# Patient Record
Sex: Female | Born: 1985 | Race: White | Hispanic: No | Smoking: Current every day smoker
Health system: Southern US, Community
[De-identification: ages and names within clinical notes are randomized; demographics above are authoritative.]

## PROBLEM LIST (undated history)

## (undated) HISTORY — PX: ABDOMINAL HYSTERECTOMY: SHX81

## (undated) HISTORY — PX: CHOLECYSTECTOMY: SHX55

---
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
## 2007-05-18 ENCOUNTER — Emergency Department (HOSPITAL_COMMUNITY): Admission: EM | Admit: 2007-05-18 | Discharge: 2007-05-18 | Payer: Self-pay | Admitting: Emergency Medicine

## 2010-02-08 ENCOUNTER — Emergency Department (HOSPITAL_COMMUNITY): Admission: EM | Admit: 2010-02-08 | Discharge: 2010-02-08 | Payer: Self-pay | Admitting: Family Medicine

## 2011-01-22 ENCOUNTER — Inpatient Hospital Stay (INDEPENDENT_AMBULATORY_CARE_PROVIDER_SITE_OTHER)
Admission: RE | Admit: 2011-01-22 | Discharge: 2011-01-22 | Disposition: A | Discharge status: Home / Self Care | Payer: Self-pay | Source: Ambulatory Visit | Attending: Family Medicine | Admitting: Family Medicine

## 2011-01-22 DIAGNOSIS — J029 Acute pharyngitis, unspecified: Secondary | ICD-10-CM

## 2011-11-08 ENCOUNTER — Encounter (HOSPITAL_COMMUNITY): Payer: Self-pay

## 2011-11-08 ENCOUNTER — Emergency Department (HOSPITAL_COMMUNITY)
Admission: EM | Admit: 2011-11-08 | Discharge: 2011-11-08 | Disposition: A | Discharge status: Home / Self Care | Payer: Self-pay | Attending: Emergency Medicine | Admitting: Emergency Medicine

## 2011-11-08 DIAGNOSIS — R51 Headache: Secondary | ICD-10-CM | POA: Insufficient documentation

## 2011-11-08 DIAGNOSIS — K029 Dental caries, unspecified: Secondary | ICD-10-CM | POA: Insufficient documentation

## 2011-11-08 MED ORDER — IBUPROFEN 800 MG PO TABS
800.0000 mg | ORAL_TABLET | Freq: Once | ORAL | Status: AC
  Administered 2011-11-08: 800 mg via ORAL
  Filled 2011-11-08: qty 1

## 2011-11-08 MED ORDER — ONDANSETRON HCL 4 MG PO TABS
4.0000 mg | ORAL_TABLET | Freq: Once | ORAL | Status: AC
  Administered 2011-11-08: 4 mg via ORAL
  Filled 2011-11-08: qty 1

## 2011-11-08 MED ORDER — HYDROCODONE-ACETAMINOPHEN 5-325 MG PO TABS
2.0000 | ORAL_TABLET | Freq: Once | ORAL | Status: AC
  Administered 2011-11-08: 2 via ORAL
  Filled 2011-11-08: qty 2

## 2011-11-08 MED ORDER — HYDROCODONE-ACETAMINOPHEN 5-325 MG PO TABS: 1.0000 | ORAL_TABLET | ORAL | Status: AC | PRN

## 2011-11-08 MED ORDER — PENICILLIN V POTASSIUM 250 MG PO TABS
500.0000 mg | ORAL_TABLET | Freq: Once | ORAL | Status: AC
  Administered 2011-11-08: 500 mg via ORAL
  Filled 2011-11-08: qty 2

## 2011-11-08 MED ORDER — AMOXICILLIN 500 MG PO CAPS: ORAL_CAPSULE | ORAL | Status: DC

## 2011-11-08 NOTE — ED Notes (Signed)
Dental pain. 

## 2011-11-08 NOTE — Discharge Instructions (Signed)
Dental Pain     Toothache is pain in or around a tooth. It may get worse with chewing or with cold or heat.   HOME CARE  · Your dentist may use a numbing medicine during treatment. If so, you may need to avoid eating until the medicine wears off. Ask your dentist about this.   · Only take medicine as told by your dentist or doctor.   · Avoid chewing food near the painful tooth until after all treatment is done. Ask your dentist about this.   GET HELP RIGHT AWAY IF:   · The problem gets worse or new problems appear.   · You have a fever.   · There is redness and puffiness (swelling) of the face, jaw, or neck.   · You cannot open your mouth.   · There is pain in the jaw.   · There is very bad pain that is not helped by medicine.   MAKE SURE YOU:   · Understand these instructions.   · Will watch your condition.   · Will get help right away if you are not doing well or get worse.   Document Released: 02/13/2008 Document Revised: 05/09/2011 Document Reviewed: 02/13/2008  Exit[employer]® Patient Information ©2012 [employer].

## 2011-11-08 NOTE — ED Provider Notes (Signed)
History     CSN: [id number]  Arrival date & time 11/08/11  Rickey Primus   First MD Initiated Contact with Patient 11/08/11 1849      Chief Complaint  Patient presents with  . Dental Pain    (Consider location/radiation/quality/duration/timing/severity/associated sxs/prior treatment) Patient is a 26 y.o. female presenting with tooth pain. The history is provided by the patient.  Dental PainThe primary symptoms include mouth pain and headaches. Primary symptoms do not include shortness of breath or cough. The symptoms began 3 to 5 days ago. The symptoms are unchanged. The symptoms occur constantly.  The headache is not associated with photophobia.  Additional symptoms include: dental sensitivity to temperature, gum swelling and jaw pain. Additional symptoms do not include: nosebleeds. Medical issues do not include: alcohol problem and chewing tobacco.    History reviewed. No pertinent past medical history.  Past Surgical History  Procedure Date  . Abdominal hysterectomy     No family history on file.  History  Substance Use Topics  . Smoking status: Never Smoker   . Smokeless tobacco: Not on file  . Alcohol Use: No    OB History    Grav Para Term Preterm Abortions TAB SAB Ect Mult Living                  Review of Systems  Constitutional: Negative for activity change.       All ROS Neg except as noted in HPI  HENT: Negative for nosebleeds and neck pain.   Eyes: Negative for photophobia and discharge.  Respiratory: Negative for cough, shortness of breath and wheezing.   Cardiovascular: Negative for chest pain and palpitations.  Gastrointestinal: Negative for abdominal pain and blood in stool.  Genitourinary: Negative for dysuria, frequency and hematuria.  Musculoskeletal: Negative for back pain and arthralgias.  Skin: Negative.   Neurological: Positive for headaches. Negative for dizziness, seizures and speech difficulty.  Psychiatric/Behavioral: Negative for  hallucinations and confusion.    Allergies  Review of patient's allergies indicates no known allergies.  Home Medications   Current Outpatient Rx  Name Route Sig Dispense Refill  . IBUPROFEN 200 MG PO TABS Oral Take 1,200 mg by mouth 2 (two) times daily as needed. For pain      BP 123/82  Pulse 90  Temp(Src) 97.9 F (36.6 C) (Oral)  Resp 18  Ht 5\' 6"  (1.676 m)  Wt 180 lb (81.647 kg)  BMI 29.05 kg/m2  SpO2 100%  Physical Exam  Nursing note and vitals reviewed. Constitutional: She is oriented to person, place, and time. She appears well-developed and well-nourished.  Non-toxic appearance.  HENT:  Head: Normocephalic.  Right Ear: Tympanic membrane and external ear normal.  Left Ear: Tympanic membrane and external ear normal.       Dental caries present right greater than left. No visible abscess noted.  Eyes: EOM and lids are normal. Pupils are equal, round, and reactive to light.  Neck: Normal range of motion. Neck supple. Carotid bruit is not present.  Cardiovascular: Normal rate, regular rhythm, normal heart sounds, intact distal pulses and normal pulses.   Pulmonary/Chest: Breath sounds normal. No respiratory distress.  Abdominal: Soft. Bowel sounds are normal. There is no tenderness. There is no guarding.  Musculoskeletal: Normal range of motion.  Lymphadenopathy:       Head (right side): No submandibular adenopathy present.       Head (left side): No submandibular adenopathy present.    She has no cervical adenopathy.  Neurological: She  is alert and oriented to person, place, and time. She has normal strength. No cranial nerve deficit or sensory deficit.  Skin: Skin is warm and dry.  Psychiatric: She has a normal mood and affect. Her speech is normal.    ED Course  Procedures (including critical care time) Pulse oximetry 100% on room air. Within normal limits by my interpretation. Labs Reviewed - No data to display No results found.   Dx: Dental caries   MDM   I have reviewed nursing notes, vital signs, and all appropriate lab and imaging results for this patient. Patient presents with 3-4 days of right-sided dental pain. She is attempted to get an appointment with her dentist but no appointment is available for 3 months. She has tried ibuprofen at home without success she's tried Orajel also without success. The plan at this time is for the patient to receive a prescription for amoxicillin, and Norco. Patient is to continue use of her Motrin.       Kathie Dike, [place] 11/08/11 1857

## 2011-11-09 NOTE — ED Provider Notes (Signed)
Medical screening examination/treatment/procedure(s) were performed by non-physician practitioner and as supervising physician I was immediately available for consultation/collaboration.  Nicoletta Dress. Colon Branch, MD 11/09/11 1656

## 2019-02-27 ENCOUNTER — Emergency Department (HOSPITAL_COMMUNITY)
Admission: EM | Admit: 2019-02-27 | Discharge: 2019-02-27 | Disposition: A | Discharge status: Home / Self Care | Payer: Self-pay | Attending: Emergency Medicine | Admitting: Emergency Medicine

## 2019-02-27 ENCOUNTER — Encounter (HOSPITAL_COMMUNITY): Payer: Self-pay | Admitting: *Deleted

## 2019-02-27 ENCOUNTER — Other Ambulatory Visit: Payer: Self-pay

## 2019-02-27 DIAGNOSIS — K0381 Cracked tooth: Secondary | ICD-10-CM | POA: Insufficient documentation

## 2019-02-27 DIAGNOSIS — Z91040 Latex allergy status: Secondary | ICD-10-CM | POA: Insufficient documentation

## 2019-02-27 DIAGNOSIS — F17200 Nicotine dependence, unspecified, uncomplicated: Secondary | ICD-10-CM | POA: Insufficient documentation

## 2019-02-27 DIAGNOSIS — K0889 Other specified disorders of teeth and supporting structures: Secondary | ICD-10-CM

## 2019-02-27 DIAGNOSIS — K029 Dental caries, unspecified: Secondary | ICD-10-CM | POA: Insufficient documentation

## 2019-02-27 MED ORDER — PENICILLIN V POTASSIUM 500 MG PO TABS: 500.0000 mg | ORAL_TABLET | Freq: Three times a day (TID) | ORAL | 0 refills | Status: DC

## 2019-02-27 MED ORDER — HYDROCODONE-ACETAMINOPHEN 5-325 MG PO TABS: ORAL_TABLET | ORAL | 0 refills | Status: DC

## 2019-02-27 NOTE — ED Provider Notes (Signed)
[facility]ERGENCY DEPARTMENT Provider Note   CSN: [id number] Arrival date & time: 02/27/19  0941     History   Chief Complaint Chief Complaint  Patient presents with  . Dental Pain    HPI Wanda Stokes is a 33 y.o. female.     HPI   Wanda Stokes is a 33 y.o. female who presents to the Emergency Department complaining of right lower dental pain for 4-5 days.  She states that one of her right lower teeth broke off recently while eating and she has been having pain to the tooth since then.  She has tried multiple OTC pain relievers and topicals without relief.  States she was unable to sleep last night due to constant, throbbing pain.  She denies fever, chills, facial swelling, neck pain, and difficulty swallowing or breathing.  She has a dentist appt in one and on half weeks.      History reviewed. No pertinent past medical history.  There are no active problems to display for this patient.   Past Surgical History:  Procedure Laterality Date  . ABDOMINAL HYSTERECTOMY    . CHOLECYSTECTOMY       OB History   No obstetric history on file.      Home Medications    Prior to Admission medications   Medication Sig Start Date End Date Taking? Authorizing Provider  amoxicillin (AMOXIL) 500 MG capsule 2 tabs by mouth twice a day, take with food. 11/08/11   Ivery QualeBryant, [place], PA-C  ibuprofen (ADVIL,MOTRIN) 200 MG tablet Take 1,200 mg by mouth 2 (two) times daily as needed. For pain    [provider]    Family History No family history on file.  Social History Social History   Tobacco Use  . Smoking status: Current Every Day Smoker  Substance Use Topics  . Alcohol use: No  . Drug use: No     Allergies   Latex, Motrin [ibuprofen], Toradol [ketorolac tromethamine], Tramadol, and Trazodone and nefazodone   Review of Systems Review of Systems  Constitutional: Negative for appetite change and fever.  HENT: Positive for dental problem. Negative for  congestion, facial swelling, sore throat and trouble swallowing.   Eyes: Negative for pain and visual disturbance.  Respiratory: Negative for chest tightness and shortness of breath.   Cardiovascular: Negative for chest pain and palpitations.  Gastrointestinal: Negative for nausea and vomiting.  Musculoskeletal: Negative for arthralgias, back pain, neck pain and neck stiffness.  Neurological: Negative for dizziness, facial asymmetry and headaches.  Hematological: Negative for adenopathy.     Physical Exam Updated Vital Signs BP 110/76   Pulse (!) 111   Temp 98.1 F (36.7 C)   Resp 20   Ht 5\' 4"  (1.626 m)   Wt 75 kg   SpO2 99%   BMI 28.38 kg/m   Physical Exam Vitals signs and nursing note reviewed.  Constitutional:      General: She is not in acute distress.    Appearance: Normal appearance. She is well-developed. She is not ill-appearing.  HENT:     Head: Normocephalic and atraumatic.     Jaw: No trismus.     Right Ear: Tympanic membrane and ear canal normal.     Left Ear: Tympanic membrane and ear canal normal.     Mouth/Throat:     Mouth: Mucous membranes are moist.     Dentition: Dental caries present. No dental abscesses.     Pharynx: Oropharynx is clear. Uvula midline. No uvula swelling.  Comments: Multiple dental caries present with ttp of the right lower second molar.  Tooth is decayed to the gumline.  Mild erythema of the gingiva.  No fluctuance or facial edema.  No trismus Eyes:     Conjunctiva/sclera: Conjunctivae normal.  Neck:     Musculoskeletal: Normal range of motion and neck supple.  Cardiovascular:     Rate and Rhythm: Regular rhythm. Tachycardia present.     Heart sounds: Normal heart sounds. No murmur.  Pulmonary:     Effort: Pulmonary effort is normal.     Breath sounds: Normal breath sounds.  Musculoskeletal: Normal range of motion.  Lymphadenopathy:     Cervical: No cervical adenopathy.  Skin:    General: Skin is warm and dry.      Capillary Refill: Capillary refill takes less than 2 seconds.  Neurological:     General: No focal deficit present.     Mental Status: She is alert.     Sensory: No sensory deficit.     Motor: No weakness or abnormal muscle tone.      ED Treatments / Results  Labs (all labs ordered are listed, but only abnormal results are displayed) Labs Reviewed - No data to display  EKG    Radiology No results found.  Procedures Procedures (including critical care time)  Medications Ordered in ED Medications - No data to display   Initial Impression / Assessment and Plan / ED Course  I have reviewed the triage vital signs and the nursing notes.  Pertinent labs & imaging results that were available during my care of the patient were reviewed by me and considered in my medical decision making (see chart for details).        Pt with dental pain for 4-5 days.  Hx of same.  Has a dentist appt in one and half weeks.  She is tachycardic on today's visit, she reports hx of tachycardia, but I have been able to verify this in her medical records.  She does appear uncomfortable holding her face in her hand, but she denies fever, chills, chest pain, dyspnea or other symptoms. She is non-toxic appearing.   No murmur is appreciated on her exam.  I feel she is appropriate for d/c home.  Strict return precautions discussed.   Final Clinical Impressions(s) / ED Diagnoses   Final diagnoses:  Pain, dental    ED Discharge Orders    None       Kem Parkinson, PA-C 02/27/19 1045    Long, Wonda Olds, MD 02/27/19 2005

## 2019-02-27 NOTE — ED Triage Notes (Signed)
Right sided dental pain for 2 days, unable to sleep

## 2019-02-27 NOTE — Discharge Instructions (Signed)
Warm water rinses to your mouth.  Follow-up with your dentist as scheduled.

## 2019-04-11 ENCOUNTER — Other Ambulatory Visit: Payer: Self-pay

## 2019-04-11 ENCOUNTER — Encounter (HOSPITAL_COMMUNITY): Payer: Self-pay | Admitting: Emergency Medicine

## 2019-04-11 ENCOUNTER — Emergency Department (HOSPITAL_COMMUNITY)
Admission: EM | Admit: 2019-04-11 | Discharge: 2019-04-11 | Disposition: A | Discharge status: Home / Self Care | Payer: Medicaid Other | Attending: Emergency Medicine | Admitting: Emergency Medicine

## 2019-04-11 DIAGNOSIS — F17200 Nicotine dependence, unspecified, uncomplicated: Secondary | ICD-10-CM | POA: Insufficient documentation

## 2019-04-11 DIAGNOSIS — G8929 Other chronic pain: Secondary | ICD-10-CM | POA: Insufficient documentation

## 2019-04-11 DIAGNOSIS — M5431 Sciatica, right side: Secondary | ICD-10-CM

## 2019-04-11 DIAGNOSIS — Z91040 Latex allergy status: Secondary | ICD-10-CM | POA: Insufficient documentation

## 2019-04-11 DIAGNOSIS — Z79899 Other long term (current) drug therapy: Secondary | ICD-10-CM | POA: Insufficient documentation

## 2019-04-11 MED ORDER — DICLOFENAC SODIUM 75 MG PO TBEC: 75.0000 mg | DELAYED_RELEASE_TABLET | Freq: Two times a day (BID) | ORAL | 0 refills | Status: DC

## 2019-04-11 MED ORDER — LIDOCAINE 5 % EX PTCH: 1.0000 | MEDICATED_PATCH | CUTANEOUS | 0 refills | Status: DC

## 2019-04-11 MED ORDER — CYCLOBENZAPRINE HCL 10 MG PO TABS: 10.0000 mg | ORAL_TABLET | Freq: Two times a day (BID) | ORAL | 0 refills | Status: DC | PRN

## 2019-04-11 MED ORDER — OXYCODONE-ACETAMINOPHEN 5-325 MG PO TABS
1.0000 | ORAL_TABLET | Freq: Once | ORAL | Status: AC
  Administered 2019-04-11: 14:00:00 1 via ORAL
  Filled 2019-04-11: qty 1

## 2019-04-11 MED ORDER — CYCLOBENZAPRINE HCL 10 MG PO TABS
10.0000 mg | ORAL_TABLET | Freq: Once | ORAL | Status: AC
  Administered 2019-04-11: 10 mg via ORAL
  Filled 2019-04-11: qty 1

## 2019-04-11 NOTE — ED Triage Notes (Signed)
Pt c/o right lower back pain that radiates down right leg x 2 days after lifting a crate that was heavy.

## 2019-04-11 NOTE — ED Triage Notes (Signed)
Seen at [facility] in [place], [place] for same issue.  Given morphine, steroid and muscle relaxer.  Pt says Morphine only last 20 minutes.  Seen home with tylenol, muscle relaxer and steroid.  Pt says tylenol upsets her stomach.

## 2019-04-11 NOTE — Discharge Instructions (Addendum)
Finish the prednisone taper you were prescribed yesterday. Use the flexeril in place of the robaxin you were prescribed yesterday Avoid lifting,  Bending,  Twisting or any other activity that worsens your pain over the next week.  Apply a heating pad to your lower back for 20 minutes 3 times daily. Stretching by flexing your hips frequently as discussed.  You should get rechecked if your symptoms are not better over the next week,  Or you develop increased pain,  Weakness in your leg(s) or loss of bladder or bowel function - these are symptoms of a worse injury. Take the diclofenac with food to avoid stomach upset.

## 2019-04-11 NOTE — ED Provider Notes (Signed)
[facility] Provider Note   CSN: [id number] Arrival date & time: 04/11/19  1330    History   Chief Complaint Chief Complaint  Patient presents with  . Back Pain    HPI Felicity CoyerKendra Fabiano is a 33 y.o. female presenting with acute on intermittent chronic low back pain which has which has been worsened for the past 2 days after lifting a heavy crate at work.   She has pain that radiates down her right posterior thigh to her knee. There has been no weakness or numbness in the lower extremities and no urinary or bowel retention or incontinence.  Patient does not have a history of cancer or IVDU.  [place], stating xrays obtained showed degenerative disk changes which are chronic with no new findings. She was started on a prednisone taper and placed on robaxin without improvement in her symptoms.    Pt drove to [place] this am with mother to help sister who lives here.  She suspects the car ride may have also aggravated her back. Will be traveling back home tomorrow     The history is provided by the patient.    History reviewed. No pertinent past medical history.  There are no active problems to display for this patient.   Past Surgical History:  Procedure Laterality Date  . ABDOMINAL HYSTERECTOMY    . CHOLECYSTECTOMY       OB History    Gravida  1   Para      Term      Preterm      AB      Living        SAB      TAB      Ectopic      Multiple      Live Births               Home Medications    Prior to Admission medications   Medication Sig Start Date End Date Taking? Authorizing Provider  amitriptyline (ELAVIL) 50 MG tablet Take 1-2 tablets by mouth at bedtime as needed. 02/10/19  Yes [provider]  FLUoxetine (PROZAC) 20 MG capsule Take 20 mg by mouth daily. 02/05/19  Yes [provider]  lamoTRIgine (LAMICTAL) 100 MG tablet Take 1 tablet by mouth at bedtime. 02/05/19  Yes  [provider]  cyclobenzaprine (FLEXERIL) 10 MG tablet Take 1 tablet (10 mg total) by mouth 2 (two) times daily as needed for muscle spasms. 04/11/19   Burgess AmorIdol, Reign Dziuba, PA-C  diclofenac (VOLTAREN) 75 MG EC tablet Take 1 tablet (75 mg total) by mouth 2 (two) times daily. 04/11/19   Burgess AmorIdol, Abbagale Goguen, PA-C  lidocaine (LIDODERM) 5 % Place 1 patch onto the skin daily. Remove & Discard patch after wearing for 12 hours.  Apply to the right lower back at the point of maximum pain. 04/11/19   Burgess AmorIdol, Keighley Deckman, PA-C    Family History History reviewed. No pertinent family history.  Social History Social History   Tobacco Use  . Smoking status: Current Every Day Smoker  . Smokeless tobacco: Never Used  Substance Use Topics  . Alcohol use: No  . Drug use: No     Allergies   Amoxicillin, Latex, Motrin [ibuprofen], Other, Toradol [ketorolac tromethamine], Tramadol, and Trazodone and nefazodone   Review of Systems Review of Systems  Constitutional: Negative for fever.  Respiratory: Negative for shortness of breath.   Cardiovascular: Negative for chest pain and leg  swelling.  Gastrointestinal: Negative for abdominal distention, abdominal pain and constipation.  Genitourinary: Negative for difficulty urinating, dysuria, flank pain, frequency and urgency.  Musculoskeletal: Positive for back pain. Negative for gait problem and joint swelling.  Skin: Negative for rash.  Neurological: Negative for weakness and numbness.     Physical Exam Updated Vital Signs BP 120/87   Pulse 73   Temp 98.1 F (36.7 C) (Oral)   Resp 16   Ht 5\' 5"  (1.651 m)   Wt 83 kg   SpO2 99%   BMI 30.45 kg/m   Physical Exam Vitals signs and nursing note reviewed.  Constitutional:      Appearance: She is well-developed.  HENT:     Head: Normocephalic.  Eyes:     Conjunctiva/sclera: Conjunctivae normal.  Neck:     Musculoskeletal: Normal range of motion and neck supple.  Cardiovascular:     Rate and Rhythm: Normal  rate.     Comments: Pedal pulses normal. Pulmonary:     Effort: Pulmonary effort is normal.  Abdominal:     General: Bowel sounds are normal. There is no distension.     Palpations: Abdomen is soft. There is no mass.  Musculoskeletal: Normal range of motion.     Lumbar back: She exhibits tenderness. She exhibits no bony tenderness, no swelling, no edema and no spasm.       Back:  Skin:    General: Skin is warm and dry.  Neurological:     Mental Status: She is alert.     Sensory: No sensory deficit.     Motor: No tremor or atrophy.     Gait: Gait normal.     Deep Tendon Reflexes:     Reflex Scores:      Patellar reflexes are 2+ on the right side and 2+ on the left side.      Achilles reflexes are 2+ on the right side and 2+ on the left side.    Comments: No strength deficit noted in hip and knee flexor and extensor muscle groups.  Ankle flexion and extension intact.      ED Treatments / Results  Labs (all labs ordered are listed, but only abnormal results are displayed) Labs Reviewed - No data to display  EKG None  Radiology No results found.  Procedures Procedures (including critical care time)  Medications Ordered in ED Medications  oxyCODONE-acetaminophen (PERCOCET/ROXICET) 5-325 MG per tablet 1 tablet (1 tablet Oral Given 04/11/19 1429)  cyclobenzaprine (FLEXERIL) tablet 10 mg (10 mg Oral Given 04/11/19 1429)     Initial Impression / Assessment and Plan / ED Course  I have reviewed the triage vital signs and the nursing notes.  Pertinent labs & imaging results that were available during my care of the patient were reviewed by me and considered in my medical decision making (see chart for details).       Pt without neuro deficit on exam, no concern for cauda equina.  No localizing midline pain.  Pt endorses flexeril has worked better for her in the past, therefore was switched to this muscle relaxer.  Also added voltaren, caution re. Stomach upset.  lidoderm  patch.  Return precautions outlined.   Final Clinical Impressions(s) / ED Diagnoses   Final diagnoses:  Sciatica of right side    ED Discharge Orders         Ordered    cyclobenzaprine (FLEXERIL) 10 MG tablet  2 times daily PRN     04/11/19 1516  diclofenac (VOLTAREN) 75 MG EC tablet  2 times daily     04/11/19 1516    lidocaine (LIDODERM) 5 %  Every 24 hours     04/11/19 1516           Burgess Amordol, Draiden Mirsky, PA-C 04/12/19 2143    Sabas SousBero, Michael M, MD 04/16/19 [id number]

## 2019-05-02 ENCOUNTER — Other Ambulatory Visit: Payer: Self-pay

## 2019-05-02 ENCOUNTER — Encounter (HOSPITAL_COMMUNITY): Payer: Self-pay | Admitting: Emergency Medicine

## 2019-05-02 ENCOUNTER — Emergency Department (HOSPITAL_COMMUNITY)
Admission: EM | Admit: 2019-05-02 | Discharge: 2019-05-02 | Discharge status: Against Medical Advice | Payer: No Typology Code available for payment source

## 2019-05-02 DIAGNOSIS — F17210 Nicotine dependence, cigarettes, uncomplicated: Secondary | ICD-10-CM | POA: Insufficient documentation

## 2019-05-02 DIAGNOSIS — Z79899 Other long term (current) drug therapy: Secondary | ICD-10-CM | POA: Diagnosis not present

## 2019-05-02 DIAGNOSIS — Z91040 Latex allergy status: Secondary | ICD-10-CM | POA: Insufficient documentation

## 2019-05-02 DIAGNOSIS — Y939 Activity, unspecified: Secondary | ICD-10-CM | POA: Insufficient documentation

## 2019-05-02 DIAGNOSIS — Y929 Unspecified place or not applicable: Secondary | ICD-10-CM | POA: Diagnosis not present

## 2019-05-02 DIAGNOSIS — S060X0A Concussion without loss of consciousness, initial encounter: Secondary | ICD-10-CM | POA: Insufficient documentation

## 2019-05-02 DIAGNOSIS — S0990XA Unspecified injury of head, initial encounter: Secondary | ICD-10-CM | POA: Diagnosis present

## 2019-05-02 DIAGNOSIS — Y999 Unspecified external cause status: Secondary | ICD-10-CM | POA: Diagnosis not present
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
## 2019-05-03 ENCOUNTER — Emergency Department (HOSPITAL_COMMUNITY): Payer: No Typology Code available for payment source

## 2019-05-03 ENCOUNTER — Emergency Department (HOSPITAL_COMMUNITY)
Admission: EM | Admit: 2019-05-03 | Discharge: 2019-05-03 | Disposition: A | Discharge status: Home / Self Care | Payer: No Typology Code available for payment source | Attending: Emergency Medicine | Admitting: Emergency Medicine

## 2019-05-03 DIAGNOSIS — S060X0A Concussion without loss of consciousness, initial encounter: Secondary | ICD-10-CM

## 2019-05-03 MED ORDER — ONDANSETRON HCL 4 MG/2ML IJ SOLN
4.0000 mg | Freq: Once | INTRAMUSCULAR | Status: AC
Start: 2019-05-03 — End: 2019-05-03
  Administered 2019-05-03: 4 mg via INTRAVENOUS
  Filled 2019-05-03: qty 2

## 2019-05-03 MED ORDER — METOCLOPRAMIDE HCL 5 MG/ML IJ SOLN
10.0000 mg | Freq: Once | INTRAMUSCULAR | Status: AC
  Administered 2019-05-03: 10 mg via INTRAVENOUS
  Filled 2019-05-03: qty 2

## 2019-05-03 MED ORDER — SODIUM CHLORIDE 0.9 % IV BOLUS
1000.0000 mL | Freq: Once | INTRAVENOUS | Status: AC
  Administered 2019-05-03: 1000 mL via INTRAVENOUS

## 2019-05-03 MED ORDER — DIPHENHYDRAMINE HCL 50 MG/ML IJ SOLN
25.0000 mg | Freq: Once | INTRAMUSCULAR | Status: AC
  Administered 2019-05-03: 25 mg via INTRAVENOUS
  Filled 2019-05-03: qty 1
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
## 2019-05-03 NOTE — ED Notes (Signed)
Pt. Ambulated well around her room.
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
## 2019-09-02 ENCOUNTER — Encounter (HOSPITAL_COMMUNITY): Payer: Self-pay | Admitting: Emergency Medicine

## 2019-09-02 ENCOUNTER — Other Ambulatory Visit: Payer: Self-pay

## 2019-09-02 DIAGNOSIS — F1210 Cannabis abuse, uncomplicated: Secondary | ICD-10-CM | POA: Insufficient documentation

## 2019-09-02 DIAGNOSIS — M5442 Lumbago with sciatica, left side: Secondary | ICD-10-CM | POA: Insufficient documentation

## 2019-09-02 DIAGNOSIS — Z79899 Other long term (current) drug therapy: Secondary | ICD-10-CM | POA: Insufficient documentation

## 2019-09-02 DIAGNOSIS — F17210 Nicotine dependence, cigarettes, uncomplicated: Secondary | ICD-10-CM | POA: Insufficient documentation

## 2019-09-02 DIAGNOSIS — Z91040 Latex allergy status: Secondary | ICD-10-CM | POA: Insufficient documentation
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
## 2019-09-03 ENCOUNTER — Emergency Department (HOSPITAL_COMMUNITY)
Admission: EM | Admit: 2019-09-03 | Discharge: 2019-09-03 | Disposition: A | Discharge status: Home / Self Care | Payer: Medicaid Other | Attending: Emergency Medicine | Admitting: Emergency Medicine

## 2019-09-03 DIAGNOSIS — M5442 Lumbago with sciatica, left side: Secondary | ICD-10-CM

## 2019-09-03 LAB — URINALYSIS, ROUTINE W REFLEX MICROSCOPIC
Bilirubin Urine: NEGATIVE
Glucose, UA: NEGATIVE mg/dL
Hgb urine dipstick: NEGATIVE
Ketones, ur: NEGATIVE mg/dL
Leukocytes,Ua: NEGATIVE
Nitrite: NEGATIVE
Protein, ur: NEGATIVE mg/dL
Specific Gravity, Urine: 1.016 (ref 1.005–1.030)
pH: 5 (ref 5.0–8.0)

## 2019-09-03 MED ORDER — PREDNISONE 50 MG PO TABS
60.0000 mg | ORAL_TABLET | Freq: Once | ORAL | Status: AC
  Administered 2019-09-03: 60 mg via ORAL
  Filled 2019-09-03: qty 1

## 2019-09-03 MED ORDER — HYDROMORPHONE HCL 1 MG/ML IJ SOLN
1.0000 mg | Freq: Once | INTRAMUSCULAR | Status: AC
  Administered 2019-09-03: 1 mg via INTRAMUSCULAR
  Filled 2019-09-03: qty 1

## 2019-09-03 MED ORDER — PREDNISONE 50 MG PO TABS: ORAL_TABLET | ORAL | 0 refills | Status: AC
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
## 2019-09-03 NOTE — Discharge Instructions (Addendum)
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
